# Patient Record
Sex: Male | Born: 1975 | Race: White | Hispanic: No | Marital: Married
Health system: Southern US, Community
[De-identification: ages and names within clinical notes are randomized; demographics above are authoritative.]

---

## 2005-02-18 ENCOUNTER — Emergency Department: Payer: Self-pay | Admitting: Emergency Medicine

## 2006-01-15 ENCOUNTER — Emergency Department: Payer: Self-pay | Admitting: General Practice

## 2006-11-25 ENCOUNTER — Emergency Department: Payer: Self-pay | Admitting: Emergency Medicine

## 2006-12-08 ENCOUNTER — Ambulatory Visit: Payer: Self-pay | Admitting: Internal Medicine

## 2007-06-04 ENCOUNTER — Emergency Department: Payer: Self-pay | Admitting: Emergency Medicine

## 2008-01-08 ENCOUNTER — Emergency Department: Payer: Self-pay | Admitting: Emergency Medicine

## 2010-02-24 ENCOUNTER — Emergency Department: Payer: Self-pay | Admitting: Emergency Medicine

## 2012-08-29 ENCOUNTER — Emergency Department: Payer: Self-pay | Admitting: Unknown Physician Specialty

## 2013-03-15 ENCOUNTER — Emergency Department: Payer: Self-pay | Admitting: Emergency Medicine

## 2013-03-15 LAB — URINALYSIS, COMPLETE
Bacteria: NONE SEEN
Bilirubin,UR: NEGATIVE
Glucose,UR: NEGATIVE mg/dL (ref 0–75)
Ketone: NEGATIVE
Nitrite: NEGATIVE
Ph: 5 (ref 4.5–8.0)
Specific Gravity: 1.021 (ref 1.003–1.030)
Squamous Epithelial: <1

## 2013-03-15 LAB — COMPREHENSIVE METABOLIC PANEL
Albumin: 3.5 g/dL (ref 3.4–5.0)
Alkaline Phosphatase: 115 U/L (ref 50–136)
BUN: 12 mg/dL (ref 7–18)
Bilirubin,Total: 0.2 mg/dL (ref 0.2–1.0)
Calcium, Total: 8.8 mg/dL (ref 8.5–10.1)
Co2: 28 mmol/L (ref 21–32)
Creatinine: 1.26 mg/dL (ref 0.60–1.30)
EGFR (African American): >60
EGFR (Non-African Amer.): >60
Glucose: 103 mg/dL — ABNORMAL HIGH (ref 65–99)
Osmolality: 287 (ref 275–301)
SGOT(AST): 18 U/L (ref 15–37)
Sodium: 144 mmol/L (ref 136–145)

## 2013-03-15 LAB — CBC
MCV: 89 fL (ref 80–100)
Platelet: 333 10*3/uL (ref 150–440)
RBC: 4.8 10*6/uL (ref 4.40–5.90)
RDW: 14.3 % (ref 11.5–14.5)
WBC: 9.8 10*3/uL (ref 3.8–10.6)

## 2013-03-15 LAB — DRUG SCREEN, URINE
Amphetamines, Ur Screen: NEGATIVE (ref ?–1000)
Cannabinoid 50 Ng, Ur ~~LOC~~: POSITIVE (ref ?–50)
Cocaine Metabolite,Ur ~~LOC~~: POSITIVE (ref ?–300)
Methadone, Ur Screen: NEGATIVE (ref ?–300)
Opiate, Ur Screen: NEGATIVE (ref ?–300)
Phencyclidine (PCP) Ur S: NEGATIVE (ref ?–25)
Tricyclic, Ur Screen: NEGATIVE (ref ?–1000)

## 2013-08-16 ENCOUNTER — Emergency Department: Payer: Self-pay | Admitting: Emergency Medicine

## 2013-11-20 ENCOUNTER — Emergency Department: Payer: Self-pay | Admitting: Internal Medicine

## 2014-02-14 ENCOUNTER — Emergency Department: Payer: Self-pay | Admitting: Emergency Medicine

## 2014-02-14 LAB — COMPREHENSIVE METABOLIC PANEL
ALK PHOS: 103 U/L
Albumin: 4.2 g/dL (ref 3.4–5.0)
Anion Gap: 14 (ref 7–16)
BUN: 11 mg/dL (ref 7–18)
Bilirubin,Total: 0.3 mg/dL (ref 0.2–1.0)
CREATININE: 1.56 mg/dL — AB (ref 0.60–1.30)
Calcium, Total: 9.1 mg/dL (ref 8.5–10.1)
Chloride: 108 mmol/L — ABNORMAL HIGH (ref 98–107)
Co2: 21 mmol/L (ref 21–32)
EGFR (African American): >60
EGFR (Non-African Amer.): 56 — ABNORMAL LOW
Glucose: 87 mg/dL (ref 65–99)
OSMOLALITY: 284 (ref 275–301)
Potassium: 3.7 mmol/L (ref 3.5–5.1)
SGOT(AST): 15 U/L (ref 15–37)
SGPT (ALT): 28 U/L (ref 12–78)
Sodium: 143 mmol/L (ref 136–145)
Total Protein: 8 g/dL (ref 6.4–8.2)

## 2014-02-14 LAB — URINALYSIS, COMPLETE
Bacteria: NONE SEEN
Bilirubin,UR: NEGATIVE
Blood: NEGATIVE
GLUCOSE, UR: NEGATIVE mg/dL (ref 0–75)
KETONE: NEGATIVE
Leukocyte Esterase: NEGATIVE
Nitrite: NEGATIVE
PROTEIN: NEGATIVE
Ph: 5 (ref 4.5–8.0)
RBC,UR: <1 /HPF (ref 0–5)
SPECIFIC GRAVITY: 1.008 (ref 1.003–1.030)

## 2014-02-14 LAB — DRUG SCREEN, URINE
Amphetamines, Ur Screen: NEGATIVE (ref ?–1000)
BARBITURATES, UR SCREEN: NEGATIVE (ref ?–200)
Benzodiazepine, Ur Scrn: POSITIVE (ref ?–200)
Cannabinoid 50 Ng, Ur ~~LOC~~: NEGATIVE (ref ?–50)
Cocaine Metabolite,Ur ~~LOC~~: POSITIVE (ref ?–300)
MDMA (Ecstasy)Ur Screen: NEGATIVE (ref ?–500)
Methadone, Ur Screen: NEGATIVE (ref ?–300)
Opiate, Ur Screen: NEGATIVE (ref ?–300)
PHENCYCLIDINE (PCP) UR S: NEGATIVE (ref ?–25)
Tricyclic, Ur Screen: NEGATIVE (ref ?–1000)

## 2014-02-14 LAB — CBC
HCT: 47.2 % (ref 40.0–52.0)
HGB: 15.6 g/dL (ref 13.0–18.0)
MCH: 30.3 pg (ref 26.0–34.0)
MCHC: 33 g/dL (ref 32.0–36.0)
MCV: 92 fL (ref 80–100)
Platelet: 365 10*3/uL (ref 150–440)
RBC: 5.14 10*6/uL (ref 4.40–5.90)
RDW: 14.1 % (ref 11.5–14.5)
WBC: 12.7 10*3/uL — AB (ref 3.8–10.6)

## 2014-02-14 LAB — ETHANOL
Ethanol %: 0.212 % — ABNORMAL HIGH (ref 0.000–0.080)
Ethanol: 212 mg/dL

## 2014-02-15 ENCOUNTER — Emergency Department: Payer: Self-pay | Admitting: Emergency Medicine

## 2014-02-15 LAB — CBC
HCT: 43.7 % (ref 40.0–52.0)
HGB: 14.5 g/dL (ref 13.0–18.0)
MCH: 30.1 pg (ref 26.0–34.0)
MCHC: 33.2 g/dL (ref 32.0–36.0)
MCV: 91 fL (ref 80–100)
PLATELETS: 307 10*3/uL (ref 150–440)
RBC: 4.82 10*6/uL (ref 4.40–5.90)
RDW: 13.9 % (ref 11.5–14.5)
WBC: 14.8 10*3/uL — AB (ref 3.8–10.6)

## 2014-02-15 LAB — URINALYSIS, COMPLETE
BACTERIA: NONE SEEN
Blood: NEGATIVE
Glucose,UR: NEGATIVE mg/dL (ref 0–75)
Hyaline Cast: 2
LEUKOCYTE ESTERASE: NEGATIVE
Nitrite: NEGATIVE
PROTEIN: NEGATIVE
Ph: 5 (ref 4.5–8.0)
Specific Gravity: 1.04 (ref 1.003–1.030)
WBC UR: 10 /HPF (ref 0–5)

## 2014-02-15 LAB — COMPREHENSIVE METABOLIC PANEL
ALK PHOS: 101 U/L
ALT: 25 U/L (ref 12–78)
ANION GAP: 10 (ref 7–16)
Albumin: 4.3 g/dL (ref 3.4–5.0)
BUN: 16 mg/dL (ref 7–18)
Bilirubin,Total: 0.3 mg/dL (ref 0.2–1.0)
CO2: 24 mmol/L (ref 21–32)
Calcium, Total: 8.8 mg/dL (ref 8.5–10.1)
Chloride: 105 mmol/L (ref 98–107)
Creatinine: 1.31 mg/dL — ABNORMAL HIGH (ref 0.60–1.30)
EGFR (African American): >60
Glucose: 101 mg/dL — ABNORMAL HIGH (ref 65–99)
Osmolality: 279 (ref 275–301)
Potassium: 3.5 mmol/L (ref 3.5–5.1)
SGOT(AST): 22 U/L (ref 15–37)
SODIUM: 139 mmol/L (ref 136–145)
TOTAL PROTEIN: 7.8 g/dL (ref 6.4–8.2)

## 2014-02-15 LAB — DRUG SCREEN, URINE
Amphetamines, Ur Screen: NEGATIVE (ref ?–1000)
BENZODIAZEPINE, UR SCRN: POSITIVE (ref ?–200)
Barbiturates, Ur Screen: NEGATIVE (ref ?–200)
COCAINE METABOLITE, UR ~~LOC~~: POSITIVE (ref ?–300)
Cannabinoid 50 Ng, Ur ~~LOC~~: POSITIVE (ref ?–50)
MDMA (ECSTASY) UR SCREEN: NEGATIVE (ref ?–500)
Methadone, Ur Screen: NEGATIVE (ref ?–300)
OPIATE, UR SCREEN: NEGATIVE (ref ?–300)
Phencyclidine (PCP) Ur S: NEGATIVE (ref ?–25)
Tricyclic, Ur Screen: NEGATIVE (ref ?–1000)

## 2014-02-15 LAB — ETHANOL: Ethanol %: <0.003 % (ref 0.000–0.080)

## 2014-02-21 ENCOUNTER — Emergency Department: Payer: Self-pay | Admitting: Emergency Medicine

## 2014-03-31 LAB — COMPREHENSIVE METABOLIC PANEL
ALK PHOS: 96 U/L
ANION GAP: 7 (ref 7–16)
Albumin: 4.2 g/dL (ref 3.4–5.0)
BILIRUBIN TOTAL: 0.3 mg/dL (ref 0.2–1.0)
BUN: 13 mg/dL (ref 7–18)
CALCIUM: 9.1 mg/dL (ref 8.5–10.1)
CO2: 25 mmol/L (ref 21–32)
Chloride: 108 mmol/L — ABNORMAL HIGH (ref 98–107)
Creatinine: 1.52 mg/dL — ABNORMAL HIGH (ref 0.60–1.30)
GFR CALC NON AF AMER: 58 — AB
Glucose: 144 mg/dL — ABNORMAL HIGH (ref 65–99)
OSMOLALITY: 282 (ref 275–301)
Potassium: 3.9 mmol/L (ref 3.5–5.1)
SGOT(AST): 16 U/L (ref 15–37)
SGPT (ALT): 29 U/L
Sodium: 140 mmol/L (ref 136–145)
TOTAL PROTEIN: 8.2 g/dL (ref 6.4–8.2)

## 2014-03-31 LAB — CBC WITH DIFFERENTIAL/PLATELET
Basophil #: 0 10*3/uL (ref 0.0–0.1)
Basophil %: 0.3 %
EOS ABS: 0 10*3/uL (ref 0.0–0.7)
Eosinophil %: 0.2 %
HCT: 44.5 % (ref 40.0–52.0)
HGB: 15.1 g/dL (ref 13.0–18.0)
LYMPHS ABS: 1 10*3/uL (ref 1.0–3.6)
Lymphocyte %: 7.1 %
MCH: 30.6 pg (ref 26.0–34.0)
MCHC: 33.9 g/dL (ref 32.0–36.0)
MCV: 90 fL (ref 80–100)
MONO ABS: 0.8 x10 3/mm (ref 0.2–1.0)
MONOS PCT: 5.5 %
NEUTROS ABS: 12.5 10*3/uL — AB (ref 1.4–6.5)
Neutrophil %: 86.9 %
Platelet: 373 10*3/uL (ref 150–440)
RBC: 4.94 10*6/uL (ref 4.40–5.90)
RDW: 14.5 % (ref 11.5–14.5)
WBC: 14.4 10*3/uL — AB (ref 3.8–10.6)

## 2014-03-31 LAB — PROTIME-INR
INR: 0.9
Prothrombin Time: 12.4 secs (ref 11.5–14.7)

## 2014-03-31 LAB — LIPASE, BLOOD: LIPASE: 78 U/L (ref 73–393)

## 2014-03-31 LAB — TROPONIN I: Troponin-I: <0.02 ng/mL

## 2014-03-31 LAB — ETHANOL
Ethanol %: <0.003 % (ref 0.000–0.080)
Ethanol: <3 mg/dL

## 2014-03-31 LAB — APTT: ACTIVATED PTT: 24.9 s (ref 23.6–35.9)

## 2014-03-31 LAB — MAGNESIUM: Magnesium: 2.3 mg/dL

## 2014-04-01 ENCOUNTER — Inpatient Hospital Stay: Payer: Self-pay | Admitting: Surgery

## 2014-04-02 LAB — URINALYSIS, COMPLETE
BLOOD: NEGATIVE
Bacteria: NONE SEEN
Bilirubin,UR: NEGATIVE
GLUCOSE, UR: NEGATIVE mg/dL (ref 0–75)
Ketone: NEGATIVE
Leukocyte Esterase: NEGATIVE
Nitrite: NEGATIVE
PH: 6 (ref 4.5–8.0)
Protein: NEGATIVE
RBC,UR: 6 /HPF (ref 0–5)
SQUAMOUS EPITHELIAL: NONE SEEN
Specific Gravity: 1.026 (ref 1.003–1.030)
WBC UR: 4 /HPF (ref 0–5)

## 2014-04-02 LAB — DRUG SCREEN, URINE
AMPHETAMINES, UR SCREEN: POSITIVE (ref ?–1000)
BARBITURATES, UR SCREEN: NEGATIVE (ref ?–200)
BENZODIAZEPINE, UR SCRN: POSITIVE (ref ?–200)
COCAINE METABOLITE, UR ~~LOC~~: POSITIVE (ref ?–300)
Cannabinoid 50 Ng, Ur ~~LOC~~: POSITIVE (ref ?–50)
MDMA (Ecstasy)Ur Screen: NEGATIVE (ref ?–500)
Methadone, Ur Screen: NEGATIVE (ref ?–300)
Opiate, Ur Screen: POSITIVE (ref ?–300)
Phencyclidine (PCP) Ur S: NEGATIVE (ref ?–25)
Tricyclic, Ur Screen: NEGATIVE (ref ?–1000)

## 2014-04-10 ENCOUNTER — Emergency Department: Payer: Self-pay | Admitting: Emergency Medicine

## 2014-04-11 LAB — CBC WITH DIFFERENTIAL/PLATELET
Basophil #: 0.1 10*3/uL (ref 0.0–0.1)
Basophil %: 0.6 %
EOS ABS: 0.3 10*3/uL (ref 0.0–0.7)
Eosinophil %: 2.2 %
HCT: 41.6 % (ref 40.0–52.0)
HGB: 14 g/dL (ref 13.0–18.0)
Lymphocyte #: 2.9 10*3/uL (ref 1.0–3.6)
Lymphocyte %: 20.4 %
MCH: 30.1 pg (ref 26.0–34.0)
MCHC: 33.6 g/dL (ref 32.0–36.0)
MCV: 90 fL (ref 80–100)
MONO ABS: 1.6 x10 3/mm — AB (ref 0.2–1.0)
MONOS PCT: 11.2 %
Neutrophil #: 9.4 10*3/uL — ABNORMAL HIGH (ref 1.4–6.5)
Neutrophil %: 65.6 %
Platelet: 397 10*3/uL (ref 150–440)
RBC: 4.64 10*6/uL (ref 4.40–5.90)
RDW: 13.8 % (ref 11.5–14.5)
WBC: 14.3 10*3/uL — ABNORMAL HIGH (ref 3.8–10.6)

## 2014-04-11 LAB — TROPONIN I: Troponin-I: <0.02 ng/mL

## 2014-04-11 LAB — COMPREHENSIVE METABOLIC PANEL
ALBUMIN: 3.2 g/dL — AB (ref 3.4–5.0)
ALT: 22 U/L
ANION GAP: 6 — AB (ref 7–16)
Alkaline Phosphatase: 79 U/L
BILIRUBIN TOTAL: 0.2 mg/dL (ref 0.2–1.0)
BUN: 15 mg/dL (ref 7–18)
CALCIUM: 8.8 mg/dL (ref 8.5–10.1)
CHLORIDE: 110 mmol/L — AB (ref 98–107)
CO2: 25 mmol/L (ref 21–32)
CREATININE: 1.32 mg/dL — AB (ref 0.60–1.30)
EGFR (African American): >60
GLUCOSE: 108 mg/dL — AB (ref 65–99)
OSMOLALITY: 283 (ref 275–301)
Potassium: 3.7 mmol/L (ref 3.5–5.1)
SGOT(AST): 10 U/L — ABNORMAL LOW (ref 15–37)
Sodium: 141 mmol/L (ref 136–145)
TOTAL PROTEIN: 7.6 g/dL (ref 6.4–8.2)

## 2014-04-14 ENCOUNTER — Ambulatory Visit: Payer: Self-pay | Admitting: Cardiothoracic Surgery

## 2014-04-20 ENCOUNTER — Emergency Department: Payer: Self-pay | Admitting: Emergency Medicine

## 2014-05-03 ENCOUNTER — Ambulatory Visit: Payer: Self-pay | Admitting: Cardiothoracic Surgery
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
